# Patient Record
Sex: Female | Born: 1980 | Race: White | Hispanic: Yes | Marital: Single | Smoking: Never smoker
Health system: Southern US, Community
[De-identification: ages and names within clinical notes are randomized; demographics above are authoritative.]

## PROBLEM LIST (undated history)

## (undated) DIAGNOSIS — Z789 Other specified health status: Secondary | ICD-10-CM

## (undated) HISTORY — PX: APPENDECTOMY: SHX54

---

## 2019-10-09 ENCOUNTER — Inpatient Hospital Stay (HOSPITAL_COMMUNITY)
Admission: EM | Admit: 2019-10-09 | Discharge: 2019-10-10 | Disposition: A | Discharge status: Home / Self Care | Payer: Self-pay | Attending: Family Medicine | Admitting: Family Medicine

## 2019-10-09 ENCOUNTER — Emergency Department (HOSPITAL_COMMUNITY): Payer: Self-pay

## 2019-10-09 ENCOUNTER — Other Ambulatory Visit (HOSPITAL_COMMUNITY): Payer: Self-pay

## 2019-10-09 DIAGNOSIS — R519 Headache, unspecified: Secondary | ICD-10-CM | POA: Insufficient documentation

## 2019-10-09 DIAGNOSIS — R109 Unspecified abdominal pain: Secondary | ICD-10-CM | POA: Insufficient documentation

## 2019-10-09 DIAGNOSIS — Z3490 Encounter for supervision of normal pregnancy, unspecified, unspecified trimester: Secondary | ICD-10-CM

## 2019-10-09 DIAGNOSIS — Z9089 Acquired absence of other organs: Secondary | ICD-10-CM | POA: Insufficient documentation

## 2019-10-09 DIAGNOSIS — O26892 Other specified pregnancy related conditions, second trimester: Secondary | ICD-10-CM | POA: Insufficient documentation

## 2019-10-09 DIAGNOSIS — O3482 Maternal care for other abnormalities of pelvic organs, second trimester: Secondary | ICD-10-CM | POA: Insufficient documentation

## 2019-10-09 DIAGNOSIS — Z3A18 18 weeks gestation of pregnancy: Secondary | ICD-10-CM

## 2019-10-09 DIAGNOSIS — O09522 Supervision of elderly multigravida, second trimester: Secondary | ICD-10-CM | POA: Insufficient documentation

## 2019-10-09 DIAGNOSIS — N83201 Unspecified ovarian cyst, right side: Secondary | ICD-10-CM

## 2019-10-09 HISTORY — DX: Other specified health status: Z78.9

## 2019-10-09 LAB — URINALYSIS, ROUTINE W REFLEX MICROSCOPIC
Bilirubin Urine: NEGATIVE
Glucose, UA: NEGATIVE mg/dL
Ketones, ur: NEGATIVE mg/dL
Nitrite: NEGATIVE
Protein, ur: NEGATIVE mg/dL
Specific Gravity, Urine: 1.008 (ref 1.005–1.030)
pH: 6 (ref 5.0–8.0)

## 2019-10-09 LAB — I-STAT BETA HCG BLOOD, ED (MC, WL, AP ONLY): I-stat hCG, quantitative: >2000 m[IU]/mL — ABNORMAL HIGH (ref <5)

## 2019-10-09 LAB — COMPREHENSIVE METABOLIC PANEL
ALT: 18 U/L (ref 0–44)
AST: 20 U/L (ref 15–41)
Albumin: 3.2 g/dL — ABNORMAL LOW (ref 3.5–5.0)
Alkaline Phosphatase: 76 U/L (ref 38–126)
Anion gap: 9 (ref 5–15)
BUN: 8 mg/dL (ref 6–20)
CO2: 23 mmol/L (ref 22–32)
Calcium: 9.2 mg/dL (ref 8.9–10.3)
Chloride: 104 mmol/L (ref 98–111)
Creatinine, Ser: 0.58 mg/dL (ref 0.44–1.00)
GFR calc Af Amer: >60 mL/min (ref >60)
GFR calc non Af Amer: >60 mL/min (ref >60)
Glucose, Bld: 86 mg/dL (ref 70–99)
Potassium: 3.5 mmol/L (ref 3.5–5.1)
Sodium: 136 mmol/L (ref 135–145)
Total Bilirubin: 0.3 mg/dL (ref 0.3–1.2)
Total Protein: 7 g/dL (ref 6.5–8.1)

## 2019-10-09 LAB — CBC
HCT: 38 % (ref 36.0–46.0)
Hemoglobin: 12.4 g/dL (ref 12.0–15.0)
MCH: 26.2 pg (ref 26.0–34.0)
MCHC: 32.6 g/dL (ref 30.0–36.0)
MCV: 80.3 fL (ref 80.0–100.0)
Platelets: 325 10*3/uL (ref 150–400)
RBC: 4.73 MIL/uL (ref 3.87–5.11)
RDW: 13.2 % (ref 11.5–15.5)
WBC: 11.5 10*3/uL — ABNORMAL HIGH (ref 4.0–10.5)
nRBC: 0 % (ref 0.0–0.2)

## 2019-10-09 LAB — LIPASE, BLOOD: Lipase: 27 U/L (ref 11–51)

## 2019-10-09 NOTE — ED Triage Notes (Signed)
Pt here with reports of headaches, nausea, epigastric pain. Pt also states she is concerned she might be pregnant. States irregular periods and lmp may. Denies sick contacts.

## 2019-10-10 ENCOUNTER — Encounter (HOSPITAL_COMMUNITY): Payer: Self-pay | Admitting: Family Medicine

## 2019-10-10 ENCOUNTER — Other Ambulatory Visit: Payer: Self-pay

## 2019-10-10 DIAGNOSIS — N83201 Unspecified ovarian cyst, right side: Secondary | ICD-10-CM

## 2019-10-10 LAB — WET PREP, GENITAL
Clue Cells Wet Prep HPF POC: NONE SEEN
Sperm: NONE SEEN
Trich, Wet Prep: NONE SEEN

## 2019-10-10 LAB — CULTURE, OB URINE

## 2019-10-10 NOTE — MAU Note (Signed)
Pt sent over from ED tonight for rt side abd pain, pt is 18 weeks , preg confirmed tonight. Pt reports the pain has been going on x 1 week, some nausea and vomiting associated with pain and also headaches off/on.

## 2019-10-10 NOTE — MAU Provider Note (Signed)
History     CSN: [id number]  Arrival date and time: 10/09/19 1621   First Provider Initiated Contact with Patient 10/10/19 0330      Chief Complaint  Patient presents with  . Headache  . Abdominal Pain   Diana Knapp is a 39 y.o. G2P1 at Unknown who presents to MAU for abdominal pain on the right side and a headache. Patient originally presented to the ED and waited for 10 hours prior to being seen and was then transferred to MAU. Patient denies any symptoms, including abdominal pain or headache at this time. Patient reports her appendix was removed in the past. Patient reports even though her pain has resolved, she came to MAU because of the cyst they found on her right ovary on the US performed in the ED.  Pt denies VB, LOF, ctx, decreased FM, vaginal discharge/odor/itching. Pt denies N/V, abdominal pain, constipation, diarrhea, or urinary problems. Pt denies fever, chills, fatigue, sweating or changes in appetite. Pt denies SOB or chest pain. Pt denies dizziness, HA, light-headedness, weakness.  Spanish interpreter used for entire visit.   OB History    Gravida  2   Para  1   Term      Preterm      AB      Living  1     SAB      TAB      Ectopic      Multiple      Live Births              Past Medical History:  Diagnosis Date  . Medical history non-contributory     Past Surgical History:  Procedure Laterality Date  . APPENDECTOMY      No family history on file.  Social History   Tobacco Use  . Smoking status: Never Smoker  . Smokeless tobacco: Never Used  Substance Use Topics  . Alcohol use: Never  . Drug use: Never    Allergies: No Known Allergies  No medications prior to admission.    Review of Systems  Constitutional: Negative for chills, diaphoresis, fatigue and fever.  Eyes: Negative for visual disturbance.  Respiratory: Negative for shortness of breath.   Cardiovascular: Negative for chest pain.  Gastrointestinal:  Negative for abdominal pain, constipation, diarrhea, nausea and vomiting.  Genitourinary: Negative for dysuria, flank pain, frequency, pelvic pain, urgency, vaginal bleeding and vaginal discharge.  Neurological: Negative for dizziness, weakness, light-headedness and headaches.   Physical Exam   Blood pressure 131/73, pulse 87, temperature 98.4 F (36.9 C), temperature source Oral, resp. rate 17, height 5' 6.14" (1.68 m), weight 74.8 kg, SpO2 100 %.  Patient Vitals for the past 24 hrs:  BP Temp Temp src Pulse Resp SpO2 Height Weight  10/10/19 0254 131/73 98.4 F (36.9 C) Oral 87 17 100 % -- --  10/10/19 0143 106/61 98.2 F (36.8 C) Oral 76 18 100 % -- --  10/09/19 1629 133/80 -- -- 97 16 98 % 5' 6.14" (1.68 m) 74.8 kg   Physical Exam Vitals and nursing note reviewed. Exam conducted with a chaperone present.  Constitutional:      General: She is not in acute distress.    Appearance: Normal appearance. She is well-developed and normal weight. She is not ill-appearing, toxic-appearing or diaphoretic.  HENT:     Head: Normocephalic and atraumatic.  Pulmonary:     Effort: Pulmonary effort is normal.  Abdominal:     General: There is no distension.  Palpations: Abdomen is soft. There is no mass.     Tenderness: There is no abdominal tenderness. There is no guarding or rebound.  Genitourinary:    General: Normal vulva.     Labia:        Right: No rash, tenderness or lesion.        Left: No rash, tenderness or lesion.   Skin:    General: Skin is warm and dry.  Neurological:     Mental Status: She is alert and oriented to person, place, and time.  Psychiatric:        Behavior: Behavior normal.        Thought Content: Thought content normal.        Judgment: Judgment normal.    Results for orders placed or performed during the hospital encounter of 10/09/19 (from the past 24 hour(s))  Lipase, blood     Status: None   Collection Time: 10/09/19  5:35 PM  Result Value Ref Range    Lipase 27 11 - 51 U/L  Comprehensive metabolic panel     Status: Abnormal   Collection Time: 10/09/19  5:35 PM  Result Value Ref Range   Sodium 136 135 - 145 mmol/L   Potassium 3.5 3.5 - 5.1 mmol/L   Chloride 104 98 - 111 mmol/L   CO2 23 22 - 32 mmol/L   Glucose, Bld 86 70 - 99 mg/dL   BUN 8 6 - 20 mg/dL   Creatinine, Ser 0.980.58 0.44 - 1.00 mg/dL   Calcium 9.2 8.9 - 11.910.3 mg/dL   Total Protein 7.0 6.5 - 8.1 g/dL   Albumin 3.2 (L) 3.5 - 5.0 g/dL   AST 20 15 - 41 U/L   ALT 18 0 - 44 U/L   Alkaline Phosphatase 76 38 - 126 U/L   Total Bilirubin 0.3 0.3 - 1.2 mg/dL   GFR calc non Af Amer >60 >60 mL/min   GFR calc Af Amer >60 >60 mL/min   Anion gap 9 5 - 15  CBC     Status: Abnormal   Collection Time: 10/09/19  5:35 PM  Result Value Ref Range   WBC 11.5 (H) 4.0 - 10.5 K/uL   RBC 4.73 3.87 - 5.11 MIL/uL   Hemoglobin 12.4 12.0 - 15.0 g/dL   HCT 14.738.0 36 - 46 %   MCV 80.3 80.0 - 100.0 fL   MCH 26.2 26.0 - 34.0 pg   MCHC 32.6 30.0 - 36.0 g/dL   RDW 82.913.2 56.211.5 - 13.015.5 %   Platelets 325 150 - 400 K/uL   nRBC 0.0 0.0 - 0.2 %  I-Stat beta hCG blood, ED     Status: Abnormal   Collection Time: 10/09/19  5:47 PM  Result Value Ref Range   I-stat hCG, quantitative >2,000.0 (H) <5 mIU/mL   Comment 3          Urinalysis, Routine w reflex microscopic Urine, Clean Catch     Status: Abnormal   Collection Time: 10/09/19  8:10 PM  Result Value Ref Range   Color, Urine YELLOW YELLOW   APPearance HAZY (A) CLEAR   Specific Gravity, Urine 1.008 1.005 - 1.030   pH 6.0 5.0 - 8.0   Glucose, UA NEGATIVE NEGATIVE mg/dL   Hgb urine dipstick SMALL (A) NEGATIVE   Bilirubin Urine NEGATIVE NEGATIVE   Ketones, ur NEGATIVE NEGATIVE mg/dL   Protein, ur NEGATIVE NEGATIVE mg/dL   Nitrite NEGATIVE NEGATIVE   Leukocytes,Ua LARGE (A) NEGATIVE   RBC /  HPF 0-5 0 - 5 RBC/hpf   WBC, UA 21-50 0 - 5 WBC/hpf   Bacteria, UA MANY (A) NONE SEEN   Squamous Epithelial / LPF 11-20 0 - 5   Mucus PRESENT    US OB  Limited  Result Date: 10/09/2019 CLINICAL DATA:  39 year old female is pregnant with unknown dates, abdominal pain. EXAM: LIMITED OBSTETRIC ULTRASOUND FINDINGS: Number of Fetuses: 1 Heart Rate:  150 bpm Movement: Present Presentation: Cephalic Placental Location: Posterior on the right Previa: Negative Amniotic Fluid (Subjective):  Within normal limits. Femur length: 2.6 cm 18 w  0 d MATERNAL FINDINGS: Cervix:  Appears closed. Uterus/Adnexae: Right ovary roughly 5.5 cm anechoic cyst with no vascular elements (image 42). The left ovary appears normal with a small 10 mm cyst or follicle (image 55). No pelvic free fluid. IMPRESSION: 1. Viable Singleton fetus with estimated gestational age of [redacted] weeks and 0 days by femur length. 2. Simple appearing right ovarian cyst, 5.5 cm. Recommend a postpartum follow-up ultrasound. This exam is performed on an emergent basis and does not comprehensively evaluate fetal size, dating, or anatomy; follow-up complete OB US should be considered if further fetal assessment is warranted. Electronically Signed   By: Odessa Fleming M.D.   On: 10/09/2019 23:12    MAU Course  Procedures  MDM -pt with right-sided abdominal pain and HA that resolved while waiting in ED without treatment -hx of appendectomy -normal abdominal exam without tenderness -cervix long/closed -no HA at this time -labs from ED: lipase, CMP, CBC - WNL -UA: hazy/sm hgb/lg leuks/many bacteria, urine sent for culture -[place] ordered from ED: single IUP, [redacted]w[redacted]d, FHR 150, vtx, posterior placenta, subjectively normal fluid, cervix closed, 5.5cm cyst on right ovary requiring ppartum f/u, no free fluid -WetPrep/GC/CT collected in MAU -pt discharged to home in stable condition  Orders Placed This Encounter  Procedures  . Culture, OB Urine    Standing Status:   Standing    Number of Occurrences:   1  . Wet prep, genital    Standing Status:   Standing    Number of Occurrences:   1  . US OB Limited    Standing Status:    Standing    Number of Occurrences:   1    Order Specific Question:   Symptom/Reason for Exam    Answer:   Pregnancy [170017]  . Lipase, blood    Standing Status:   Standing    Number of Occurrences:   1  . Comprehensive metabolic panel    Standing Status:   Standing    Number of Occurrences:   1  . CBC    Standing Status:   Standing    Number of Occurrences:   1  . Urinalysis, Routine w reflex microscopic    Standing Status:   Standing    Number of Occurrences:   1  . Diet NPO time specified    Standing Status:   Standing    Number of Occurrences:   1  . I-Stat beta hCG blood, ED    Standing Status:   Standing    Number of Occurrences:   1  . Discharge patient    Order Specific Question:   Discharge disposition    Answer:   01-Home or Self Care [1]    Order Specific Question:   Discharge patient date    Answer:   10/10/2019   No orders of the defined types were placed in this encounter.   Assessment and Plan  1. Right ovarian cyst   2. Pregnancy   3. Intrauterine pregnancy   4. [redacted] weeks gestation of pregnancy     Allergies as of 10/10/2019   No Known Allergies     Medication List    You have not been prescribed any medications.     -will call with culture results, if positive -start [facility] -safe meds in pregnancy list given -OB providers list given -return MAU precautions given -pt discharged to home in stable condition  Joni Reining E Salle Brandle 10/10/2019, 3:54 AM

## 2019-10-10 NOTE — ED Provider Notes (Signed)
Patient to ED for evaluation of lower abdominal pain and cramping, points to RLQ/right pelvis. No vaginal bleeding or discharge. No fever. She has had nausea. She was suspicious she was pregnant but not certain. LMP May 2021 - normally regular periods.   Pregnancy confirmed after arrival to the ED.   Patient with low right abdominal pain in early pregnancy. No bleeding. Mild hypotension, asymptomatic.   Discussed with Dr. Adrian Blackwater who will see her in the MAU.   Elpidio Anis, PA-C 10/11/19 0351    Shon Baton, MD 10/11/19 [phone]

## 2019-10-10 NOTE — Discharge Instructions (Signed)
Las medicinas seguras para tomar Academic librarian  Safe Medications in Pregnancy  Acn:  Benzoyl Peroxide (Perxido de benzolo)  Salicylic Acid (cido saliclico)  Dolor de espalda/Dolor de cabeza:  Tylenol: 2 pastillas de concentracin regular cada 4 horas O 2 pastillas de concentracin fuerte cada 6 horas  Resfriados/Tos/Alergias:  Benadryl (sin alcohol) 25 mg cada 6 horas segn lo necesite Breath Right strips (Tiras para respirar correctamente)  Claritin  Cepacol (pastillas de chupar para la garganta)  Chloraseptic (aerosol para la garganta)  Cold-Eeze- hasta tres veces por da  Cough drops (pastillas de chupar para la tos, sin alcohol)  Flonase (con receta mdica solamente)  Guaifenesin  Mucinex  Robitussin DM (simple solamente, sin alcohol)  Saline nasal spray/drops (Aerosol nasal salino/gotas) Sudafed (pseudoephedrine) y  Actifed * utilizar slo despus de 12 semanas de gestacin y si no tiene la presin arterial alta.  Tylenol Vicks  VapoRub  Zinc lozenges (pastillas para la garganta)  Zyrtec  Estreimiento:  Colace  Ducolax (supositorios)  Fleet enema (lavado intestinal rectal)  Glycerin (supositorios)  Metamucil  Milk of magnesia (leche de magnesia)  Miralax  Senokot  Smooth Move (t)  Diarrea:  Kaopectate Imodium A-D  *NO tome Pepto-Bismol  Hemorroides:  Anusol  Anusol HC  Preparation H  Tucks  Indigestin:  Tums  Maalox  Mylanta  Zantac  Pepcid  Insomnia:  Benadryl (sin alcohol) 25mg  cada 6 horas segn lo necesite  Tylenol PM  Unisom, no Gelcaps  Calambres en las piernas:  Tums  MagGel Nuseas/Vmitos:  Bonine  Dramamine  Emetrol  Ginger (extracto)  Sea-Bands  Meclizine  Medicina para las nuseas que puede tomar durante el embarazo: Unisom (doxylamine succinate, pastillas de 25 mg) Tome una pastilla al da al [place]. Si los sntomas no estn adecuadamente controlados, la dosis puede aumentarse hasta una dosis mxima recomendada de [employer] al da (1/2 pastilla por la [place], 1/2 pastilla a media tarde y Neomia Dear pastilla al [place]). Pastillas de Vitamina B6 de 100mg . Tome ConAgra Foods veces al da (hasta 200 mg por da).  Erupciones en la piel:  Productos de Aveeno  Benadryl cream (crema o una dosis de 25mg  cada 6 horas segn lo necesite)  Calamine Lotion (locin)  1% cortisone cream (crema de cortisona de 1%)  nfeccin vaginal por hongos (candidiasis):  Gyne-lotrimin 7  Monistat 7   **Si est tomando varias medicinas, por favor revise las etiquetas para Art gallery manager los mismos ingredientes [place]. **Tome la medicina segn lo indicado en la etiqueta. **No tome ms de 400 mg de Tylenol en 24 horas. **No tome medicinas que contengan aspirina o ibuprofeno.          Cuidados prenatales Prenatal Care El cuidado prenatal es la atencin de la salud durante el [place]. Ayuda a que usted y su beb en gestacin (feto) se mantengan tan saludables como sea posible. El cuidado prenatal puede brindarlo Agustina Caroli, un mdico de atencin primaria o un especialista en parto y Psychiatrist ([place]). Jill Alexanders modo me afecta? Durante el embarazo, la controlarn minuciosamente para Engineer, manufacturing cualquier afeccin que Agricultural consultant. Para disminuir el riesgo de sufrir Clinical cytogeneticist, usted y el mdico hablarn acerca de las afecciones subyacentes que tenga. Cmo afecta esto al beb? El cuidado prenatal recibido desde un principio y de forma peridica aumenta la probabilidad de que su beb permanezca sano durante el embarazo. El cuidado prenatal disminuye el riesgo de que el beb:  Nazca de forma temprana (prematuramente).  Sea  ms pequeo de lo esperado al nacer (pequeo para la edad gestacional). Qu puedo esperar en la primera visita de cuidado prenatal? Su primera visita de cuidado prenatal probablemente ser la ms larga. Programe la primera visita de cuidado prenatal tan pronto como advierta que est  embarazada. Su primera visita es un buen momento para hacer preguntas o hablar sobre las inquietudes que tenga acerca del [place]. En la visita, usted y el mdico hablarn acerca de:  Los antecedentes mdicos, incluidos los siguientes: ? Printmaker anterior. ? Sus antecedentes mdicos familiares. ? Los antecedentes mdicos del padre del beb. ? Cualquier afeccin de salud a largo plazo (crnica) que tenga y cmo controlarla. ? Cirugas o procedimientos a los que se someti. ? Consumo actual de medicamentos recetados o de venta libre, hierbas o suplementos.  Otros factores que podran representar un riesgo para el beb, incluidos los siguientes:  Su entorno Facilities manager y sus niveles de estrs, por ejemplo: ? Exposicin al abuso o la violencia. ? Problemas econmicos en casa. ? Afecciones de salud mental que tenga.  Sus hbitos de salud diarios, incluida la dieta y la actividad fsica. Su mdico tambin:  Medir su peso, altura y presin arterial.  Le realizar un examen fsico, incluido un examen plvico y [place].  Le realizar anlisis de [place] y [place] para detectar: ? Infeccin de las vas urinarias. ? Enfermedades de transmisin sexual (ETS). ? Niveles bajos de hierro en la sangre (anemia). ? Grupo sanguneo y ciertas protenas en los glbulos rojos (anticuerpos Rh). ? Infecciones e inmunidad a los virus, como el de la hepatitis B y Social research officer, government. ? VIH (virus de inmunodeficiencia humana).  Le realizar una ecografa para confirmar el crecimiento y desarrollo del beb, y para ayudar a predecir la fecha estimada (estimated due date, EDD) para el nacimiento. Esta ecografa se realiza con una sonda que se introduce en la vagina (ecografa transvaginal).  Analizar sus opciones de estudios de deteccin genticos.  Le brindar informacin acerca de cmo mantenerse sana y Pharmacologist sano a su beb, por ejemplo: ? Nutricin y vitaminas. ? Actividad fsica. ? Cmo controlar los  sntomas del [place], como nuseas y vmitos (nuseas matutinas). ? Infecciones y sustancias que pueden ser perjudiciales para el beb, y cmo evitarlas. ? [employer]. ? Cuidado dental. ? Trabajo. ? Viajes. ? Signos de advertencia a los que debe estar atenta y cundo llamar al mdico. Con qu frecuencia tendr que asistir a visitas de cuidado prenatal? Despus de la primera visita de cuidado prenatal, tendr que asistir a visitas en forma regular durante el embarazo. El cronograma de visitas a menudo es el siguiente:  Hasta la semana 28 de embarazo: una vez cada 4 semanas.  Desde la semana 28 a la 36: una vez cada 2 semanas.  Despus de la semana 36: todas las semanas [employer] . Es posible que algunas mujeres deban asistir a visitas con mayor o Adult nurse frecuencia segn las afecciones subyacentes de salud que tengan y la salud del beb. Concurra a todas las visitas de cuidado prenatal y de seguimiento como se lo haya indicado el mdico. Esto es importante. Qu sucede durante las visitas rutinarias de cuidado prenatal? Su mdico:  Medir su peso y presin arterial.  Verificar la presencia de sonidos cardacos fetales.  Medir la altura de su tero y abdomen (altura uterina). Esta puede medirse aproximadamente a partir de la semana 20 del [place].  Controlar la posicin del beb dentro del tero.  Conley Rolls  har preguntas acerca de su dieta, patrones de sueo y si puede sentir los movimientos del beb.  Revisar los signos de advertencia a los que debe estar atenta y los signos del trabajo de [place].  Le preguntar acerca de los sntomas relacionados con el embarazo que tenga y cmo est lidiando con ellos. Entre los sntomas se pueden incluir los siguientes: ? Dolores de [place]. ? Nuseas y vmitos. ? Secrecin vaginal. ? Hinchazn. ? Fatiga. ? Estreimiento. ? Cualquier molestia, incluido el dolor plvico o de espalda. Haga una lista de las preguntas que tenga  para hacerle al mdico en las visitas de [place]. Qu estudios me podran Scientist, product/process development las visitas de cuidado prenatal? Es posible que le realicen anlisis de [place], [place] y estudios de diagnstico por imgenes durante todo el [place], como los siguientes:  Este anlisis examina la presencia de glucosa, protenas o signos de infeccin en la orina.  Pruebas de glucosa para detectar una forma de diabetes que pueda desarrollarse durante el embarazo (diabetes mellitus gestacional). Generalmente, esto se realiza alrededor de lasemana 24 de embarazo.  Sherlyn Lees para verificar el crecimiento y desarrollo del beb y Engineer, manufacturing defectos congnitos. Generalmente, esto se realiza alrededor de lasemana 20 de embarazo.  Un estudio para Arboriculturist infeccin por estreptococos del grupo B (EGB). Generalmente, esto se realiza alrededor de lasemana 36 de embarazo.  Pruebas genticas. Estos pueden incluir anlisis de sangre o estudios de diagnstico por imgenes, como una ecografa. Algunos estudios genticos se Web designer trimestre de Psychiatrist y otros durante el segundo trimestre. Qu otras cosas puedo esperar durante las visitas de cuidado prenatal? El mdico puede recomendarle que se aplique algunas vacunas durante el [place]. Estas pueden incluir las siguientes:  Una vacuna contra la gripe anual. Esto es especialmente importante si estar embarazada durante la temporada de gripe.  La vacuna Tdap (ttanos, difteria y [place]). Recibir esta vacuna durante el embarazo puede proteger al beb de la tos convulsa (tos ferina) despus del nacimiento. Esta vacuna puede recomendarse [employer] 27 y 36 de [place]. Ms adelante en su Vanetta Mulders, su mdico puede proporcionarle informacin acerca de:  Clases para prepararse para el parto y Patent examiner.  Cmo elegir un mdico para el beb.  Bancos de cordn umbilical.  Lactancia materna.  Utilizacin de mtodos anticonceptivos  despus del nacimiento del beb.  La unidad de parto y [place] de parto del hospital, y cmo programar una visita.  Cmo registrarse en el hospital antes de comenzar el [place] de [place]. Dnde buscar ms informacin  Oficina para la Salud de Architectural technologist (Office on [employer] Health): [email or web address]  Asociacin [place]wyn[place]eth Aliment del [place] (American Pregnancy Association): [email or web address].  March of Dimes: marchofdimes.org Resumen  El cuidado prenatal [place] a que usted y su beb se mantengan tan saludables como sea posible durante el [place].  Su primera visita de cuidado prenatal probablemente ser la ms larga.  Tendr que asistir a visitas y International aid/development worker estudios durante todo el embarazo para Chief Operating Officer su salud y la salud del beb.  Lleve una lista de preguntas para hacerle al mdico durante las visitas.  Asegrese de concurrir a todas las visitas de cuidado prenatal y de seguimiento con su mdico. Esta informacin no tiene Theme park manager el consejo del mdico. Asegrese de hacerle al mdico cualquier pregunta que tenga. Document Revised: 04/07/2017 Document Reviewed: 04/07/2017 Elsevier Patient Education  2020 Elsevier Inc.        Dolor abdominal durante el embarazo Abdominal Pain During Pregnancy  El dolor abdominal es comn durante el [place] y tiene muchas causas posibles. Algunas causas son ms graves que otras, y a Advertising account executive causa se desconoce. El dolor abdominal puede ser un indicio de que est comenzando el [place]. Tambin puede ser ocasionado por el crecimiento y estiramiento de los msculos y ligamentos durante el Psychiatrist. Siempre informe a su mdico si siente dolor abdominal. Siga estas indicaciones en su casa:  No tenga relaciones sexuales ni se coloque nada dentro de la vagina hasta que el dolor haya desaparecido completamente.  Descanse todo lo que pueda [employer] dolor se le haya calmado.  Beba suficiente lquido para Photographer orina de color amarillo  plido.  Tome los medicamentos de venta libre y los recetados solamente como se lo haya indicado el mdico.  Oceanographer a todas las visitas de control como se lo haya indicado el mdico. Esto es importante. Comunquese con un mdico si:  El dolor contina o empeora despus de Lawyer.  Siente dolor en la parte inferior del abdomen que: ? Va y viene en intervalos regulares. ? Se extiende a la espalda. ? Es parecido a los Tree surgeon.  Siente dolor o ardor al Geographical information systems officer. Solicite ayuda de inmediato si:  Tiene fiebre o siente escalofros.  Tiene una hemorragia vaginal abundante.  Tiene una prdida de lquido por la vagina.  Elimina tejidos por la vagina.  Vomita o tiene diarrea durante ms de 24horas.  El beb se mueve menos de lo habitual.  Se siente dbil o se desmaya.  Le falta el aire.  Siente dolor intenso en la parte superior del abdomen. Resumen  El dolor abdominal es comn durante el [place] y tiene muchas causas posibles.  Si siente dolor abdominal durante el embarazo, informe al mdico de inmediato.  Siga las indicaciones del mdico para el cuidado en el hogar y concurra a todas las visitas de control como se lo hayan indicado. Esta informacin no tiene Theme park manager el consejo del mdico. Asegrese de hacerle al mdico cualquier pregunta que tenga. Document Revised: 08/10/2016 Document Reviewed: 08/10/2016 Elsevier Patient Education  2020 [employer].       Prenatal Care Providers           [facility] @ [facility] - accepts patients without insurance  Phone: [phone]  [facility] [employer] @ [facility]   Phone: [phone]  [facility]       Phone: [phone]            [facility] @ [place]     Phone: [phone]          Center for [id number] @ [employer]   Phone: [phone]  [facility] @ [facility] - accepts patients  without insurance  Phone: [phone]  [facility] for [facility] @ [facility] Phone: [phone]     [facility] Department - accepts patients without insurance Phone: [phone]  [facility]  Phone: [phone]  [facility] Phone: [phone]  [facility] Phone: [phone]  [facility] Phone: [phone]  [facility] Phone: [phone]  [facility]  Phone: [phone]         [phone] trimestre de embarazo Second Trimester of Pregnancy El segundo trimestre va desde la semana14 hasta la 27, desde el cuarto hasta el sexto mes, y suele ser el momento en el que mejor se siente. Su organismo se ha adaptado a Gwynneth Aliment,  y comienza a sentirse fsicamente mejor. En general, las nuseas matutinas han disminuido o han desaparecido completamente, puede tener ms energa y un aumento de apetito. El segundo trimestre es tambin la poca en la que el feto se desarrolla rpidamente. Hacia el final del sexto mes, el feto mide aproximadamente 9pulgadas (23cm) y pesa alrededor de 1 libras (700g). Es probable que sienta que el beb se Teacher, English as a foreign language (da pataditas) entre las 16 y 20semanas del Psychiatrist. Cambios en el cuerpo durante el segundo trimestre Su cuerpo continua experimentando numerosos cambios durante su segundo trimestre. Estos cambios varan de [place] a [place].  Seguir American Standard Companies. Notar que la parte baja del abdomen sobresale.  Podrn aparecer las primeras Albertson's caderas, el abdomen y las [place].  Es posible que tenga dolores de cabeza que pueden aliviarse con ciertos medicamentos. Los medicamentos que tome deben estar aprobados por el mdico.  Tal vez tenga necesidad de orinar con ms frecuencia porque el feto est ejerciendo presin sobre la vejiga.  Debido al Vanetta Mulders podr sentir Anthoney Harada estomacal con frecuencia.  Puede estar estreida, ya que ciertas  hormonas enlentecen los movimientos de los msculos que [employer] Life Insurance desechos a travs de los intestinos.  Pueden aparecer hemorroides o abultarse e hincharse las venas (venas varicosas).  Puede sentir dolor en la espalda. Esto se debe a: ? Aumento de peso. ? Las hormonas del Management consultant las articulaciones en la pelvis. ? Un cambio en el peso y los msculos que ayudan a Pharmacologist su equilibrio.  Sus pechos seguirn creciendo y se pondrn cada vez ms sensibles.  Las Veterinary surgeon y estar sensibles al cepillado y al hilo dental.  Pueden aparecer zonas oscuras o manchas (cloasma, mscara del [place]) en el rostro. Esto probablemente se atenuar despus del nacimiento del beb.  Es posible que se forme una lnea oscura desde el ombligo hasta la zona del pubis (linea nigra). Esto probablemente se atenuar despus del nacimiento del beb.  Tal vez haya cambios en el cabello. Esto cambios pueden incluir su engrosamiento, crecimiento rpido y [employer] Waste Industries textura. Adems, a algunas mujeres se les cae el cabello durante o despus del embarazo, o tienen el cabello seco o fino. Lo ms probable es que el cabello se le normalice despus del nacimiento del beb. Qu debe esperar en las visitas prenatales Durante una visita prenatal de rutina:  La pesarn para asegurarse de que usted y el feto estn creciendo normalmente.  Le tomarn la presin arterial.  Le medirn el abdomen para controlar el desarrollo del beb.  Se escucharn los latidos cardacos fetales.  Se evaluarn los resultados de los estudios solicitados en visitas anteriores. El mdico puede preguntarle lo siguiente:  Cmo se siente.  Si siente los movimientos del beb.  Si ha tenido sntomas anormales, como prdida de lquido, [place], dolores de cabeza intensos o clicos abdominales.  Si est consumiendo algn producto que contenga tabaco, como cigarrillos, tabaco de Theatre manager y Administrator, Civil Service.  Si tiene  [employer]gate[employer]. Otros estudios que podrn realizarse durante el segundo trimestre incluyen lo siguiente:  Anlisis de sangre para detectar lo siguiente: ? Concentraciones de hierro bajas (anemia). ? Nivel alto de azcar en la sangre que afecta a las mujeres embarazadas (diabetes gestacional) entre las semanas 24 y 60. ? Anticuerpos Rh. Esto es para detectar una protena en los glbulos rojos (factor Rh).  Anlisis de orina para detectar infecciones, diabetes o protenas en la orina.  Una ecografa para confirmar que el beb  crece y se desarrolla correctamente.  Una amniocentesis para diagnosticar posibles problemas genticos.  Estudios del feto para descartar espina bfida y sndrome de [place].  Prueba del VIH (virus de inmunodeficiencia humana). Los exmenes prenatales de rutina incluyen la prueba de deteccin del VIH, a menos que decida no Futures trader. Siga estas indicaciones en su casa: Medicamentos  Siga las indicaciones del mdico en relacin con el uso de medicamentos. Durante el embarazo, hay medicamentos que pueden tomarse y otros que no.  Tome vitaminas prenatales que contengan por lo menos (?g) de cido flico.  Si est estreida, tome un laxante suave, si el mdico lo autoriza. Qu debe comer y beber   Meriel Flavors una dieta equilibrada que incluya gran cantidad de frutas y verduras frescas, cereales integrales, buenas fuentes de protenas como carnes [place], huevos o tofu, y lcteos descremados. El mdico la ayudar a Production assistant, radio cantidad de peso que puede [place].  No coma carne cruda ni quesos sin cocinar. Estos elementos contienen grmenes que pueden causar defectos congnitos en el beb.  Si no consume muchos alimentos con calcio, hable con su mdico sobre si debera tomar un suplemento diario de calcio.  Limite el consumo de alimentos con alto contenido de grasas y azcares procesados, como alimentos fritos o dulces.  Para evitar el  estreimiento: ? Bebe suficiente lquido para mantener la orina clara o de color amarillo plido. ? Consuma alimentos ricos en fibra, como frutas y verduras frescas, cereales integrales y frijoles. Actividad  Haga ejercicio solamente como se lo haya indicado el mdico. La mayora de las mujeres pueden continuar su rutina de ejercicios durante el [place]. Intente realizar como mnimo de actividad fsica por lo menos 5das a la semana. Deje de hacer ejercicio si experimenta contracciones uterinas.  No levante objetos pesados, use zapatos de tacones bajos y [address].  Puede seguir Calpine Corporation, a menos que el mdico le indique lo contrario. Alivio del dolor y del Dentist  Use un sostn que le brinde buen soporte para prevenir las molestias causadas por la sensibilidad en los pechos.  Dese baos de asiento con agua tibia para Engineer, materials o las molestias causadas por las hemorroides. Use una crema para las hemorroides si el mdico la autoriza.  Descanse con las piernas elevadas si tiene calambres o dolor de cintura.  Si tiene venas varicosas, use medias de descanso. Eleve los pies durante , 3 o 4veces por da. Limite el consumo de sal en su dieta. Cuidados prenatales  Escriba sus preguntas. Llvelas cuando concurra a las visitas prenatales.  Concurra a todas las visitas prenatales tal como se lo haya indicado el mdico. Esto es importante. Seguridad  Use el cinturn de seguridad en todo momento mientras conduce.  Haga una lista de los nmeros de telfono de Associate Professor, que [employer] nmeros de telfono de familiares, [place], el hospital y los departamentos de polica y bomberos. Instrucciones generales  Pdale al mdico que la derive a clases de educacin prenatal en su localidad. Debe comenzar a tomar las clases antes de que empiece el mes6 de [place].  Pida ayuda si tiene necesidades nutricionales o de asesoramiento  Academic librarian. El mdico puede aconsejarla o derivarla a especialistas para que la ayuden con diferentes necesidades.  No se d baos de inmersin en agua caliente, baos turcos ni saunas.  No se haga duchas vaginales ni use tampones o toallas higinicas perfumadas.  No mantenga las piernas cruzadas durante [place].  Evite el  contacto con las bandejas sanitarias de los gatos y la tierra que estos animales usan. Estos elementos contienen bacterias que pueden causar defectos congnitos al beb y la posible prdida del feto debido a un aborto espontneo o muerte fetal.  Evite fumar, consumir hierbas, beber alcohol y tomar frmacos que no le hayan recetado. Las sustancias qumicas que estos productos contienen pueden afectar la formacin y el desarrollo del beb.  No consuma ningn producto que contenga nicotina o tabaco, como cigarrillos y Administrator, Civil Servicecigarrillos electrnicos. Si necesita ayuda para dejar de fumar, consulte al American Expressmdico.  Visite a su dentista si an no lo ha Occupational hygienisthecho durante el embarazo. Use un cepillo de dientes blando para higienizarse los dientes y psese el hilo dental con suavidad. Comunquese con un mdico si:  Tiene mareos.  Siente clicos leves, presin en la pelvis o dolor persistente en el abdomen.  Tiene nuseas, vmitos o diarrea persistentes.  Brett Fairybserva una secrecin vaginal con mal olor.  Siente dolor al ConocoPhillipsorinar. Solicite ayuda de inmediato si:  Tiene fiebre.  Tiene una prdida de lquido por la vagina.  Tiene sangrado o pequeas prdidas vaginales.  Siente dolor intenso o clicos en el abdomen.  Sube de peso o baja de peso rpidamente.  Tiene dificultad para respirar y siente dolor de pecho.  Sbitamente se le hinchan mucho el rostro, las Costill[place]os, los tobillos, los pies o las piernas.  No ha sentido los movimientos del beb durante Georgianne Fickuna hora.  Siente un dolor de cabeza intenso que no se alivia al tomar United Parcelmedicamentos.  Nota cambios en la  visin. Resumen  El segundo trimestre va desde la semana14 hasta la 27, desde el cuarto hasta el sexto mes. Es tambin una poca en la que el feto se desarrolla rpidamente.  Su organismo atraviesa por muchos cambios durante el [place]embarazo. Estos cambios varan de [place]una mujer a [place]otra.  Evite fumar, consumir hierbas, beber alcohol y tomar frmacos que no le hayan recetado. Estas sustancias qumicas afectan la formacin y el desarrollo de su beb.  No consuma ningn producto que contenga tabaco, lo que incluye cigarrillos, tabaco de Theatre managermascar y Administrator, Civil Servicecigarrillos electrnicos. Si necesita ayuda para dejar de fumar, consulte al mdico.  Comunquese con su mdico si tiene preguntas sobre esto. Concurra a todas las visitas prenatales tal como se lo haya indicado el mdico. Esto es importante. Esta informacin no tiene Theme park managercomo fin reemplazar el consejo del mdico. Asegrese de hacerle al mdico cualquier pregunta que tenga. Document Revised: 06/28/2016 Document Reviewed: 06/28/2016 Elsevier Patient Education  2020 [employer]        Dolor del ligamento redondo Round Ligament Pain  El ligamento redondo es un cordn de msculo y tejido que sirve de sostn para Careers information officerel tero. Puede volverse una fuente de dolor durante el embarazo si se distiende o se torsiona a medida que el beb crece. Generalmente, el dolor Cendant Corporationempieza en el segundo trimestre (semanas 13 a 28) de Yeguadaembarazo, y Software engineerpuede aparecer y Landscape architectdesaparecer hasta el momento del [place]. No se trata de un problema grave y no es perjudicial para el beb. El dolor del ligamento redondo suele ser agudo y punzante, y durar poco tiempo, pero tambin puede ser sordo, persistente y continuo. Se lo percibe en la regin inferior del abdomen o en la ingle. A menudo comienza en la zona ms profunda de la ingle y se extiende hacia regin externa de la cadera. El dolor puede producirse cuando usted:  Cambia sbitamente de posicin, como pasar rpidamente de estar sentada a ponerse de  pie.  Se da vuelta en la cama.  Tose o estornuda.  Hace actividad fsica. Siga estas indicaciones en su casa:   Controle su afeccin para detectar cualquier cambio.  Cuando el dolor comience, reljese. Luego pruebe cualquiera de estos mtodos para aliviar el dolor: ? Psychologist, counselling. ? Flexionar las rodillas hacia el abdomen. ? Acostarse de costado con una almohada debajo del abdomen y [employer] las piernas. ? Sentarse en una baera con agua tibia durante 15 a o hasta que el dolor desaparezca.  Tome los medicamentos de venta libre y los recetados solamente como se lo haya indicado el mdico.  Muvase lentamente cuando se siente o se ponga de pie.  No haga caminatas largas si le generan dolor.  Suspenda o reduzca las actividades fsicas si Public relations account executive.  Concurra a todas las visitas de control como se lo haya indicado el mdico. Esto es importante. Comunquese con un mdico si:  El dolor no desaparece con Scientist, research (medical).  Tiene un dolor en la espalda que no tena antes.  El medicamento no resulta eficaz. Solicite ayuda inmediatamente si:  Tiene fiebre o escalofros.  Tiene contracciones uterinas.  Tiene una hemorragia vaginal abundante.  Tiene nuseas o vmitos.  Tiene diarrea.  Siente dolor al [employer]. Resumen  El dolor del ligamento redondo se siente en la parte inferior del abdomen o la ingle. Generalmente es un dolor agudo y punzante, y dura poco tiempo. Tambin puede ser un dolor sordo, persistente y continuo.  Este dolor por lo general empieza en el segundo trimestre (semanas 13 a 28). Se produce porque el tero se estira a medida que el beb crece, y no es perjudicial para el beb.  Usted puede notar el dolor cuando cambia sbitamente de posicin, cuando toce o estornuda, o durante la actividad fsica.  Relajarse, flexionar las rodillas hacia el abdomen, acostarse sobre un lado o tomar un bao de agua tibia pueden ayudar a Building control surveyor.  Solicite ayuda a su mdico si el dolor no desaparece o si tiene hemorragia vaginal, nuseas, vmitos, diarrea o dolor al [employer]. Esta informacin no tiene Theme park manager el consejo del mdico. Asegrese de hacerle al mdico cualquier pregunta que tenga. Document Revised: 09/29/2017 Document Reviewed: 09/29/2017 Elsevier Patient Education  2020 [employer].

## 2019-10-11 LAB — GC/CHLAMYDIA PROBE AMP (~~LOC~~) NOT AT ARMC
Chlamydia: NEGATIVE
Comment: NEGATIVE
Comment: NORMAL
Neisseria Gonorrhea: NEGATIVE

## 2021-11-27 IMAGING — US US OB LIMITED
1 series · 14 of 28 positions shown · non-contrast
Comparison: none

CLINICAL DATA: 39-year-old female is pregnant with unknown dates,
abdominal pain.

EXAM:
LIMITED OBSTETRIC ULTRASOUND

[Series 1: us ob comp less 14 wks · 59 acquisitions, 14 frames shown]
[im 3/59]
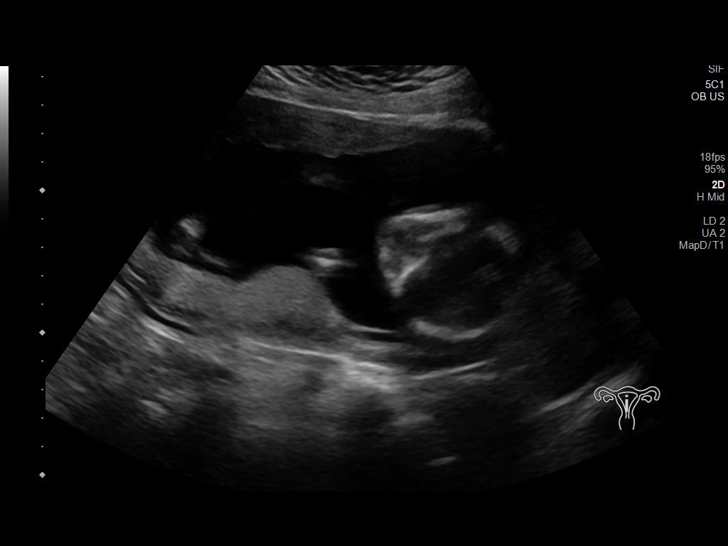
[im 7/59]
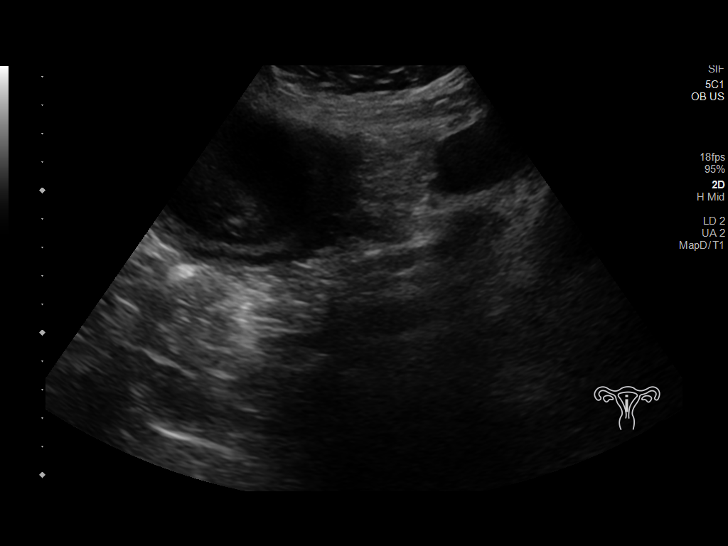
[im 11/59]
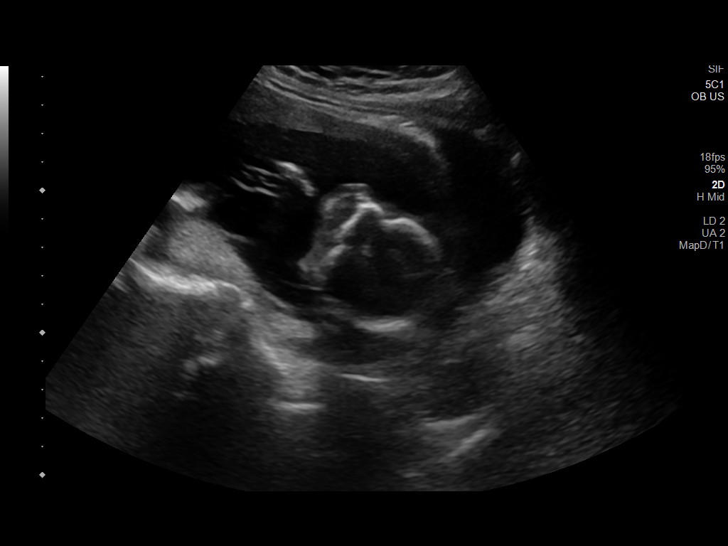
[im 16/59]
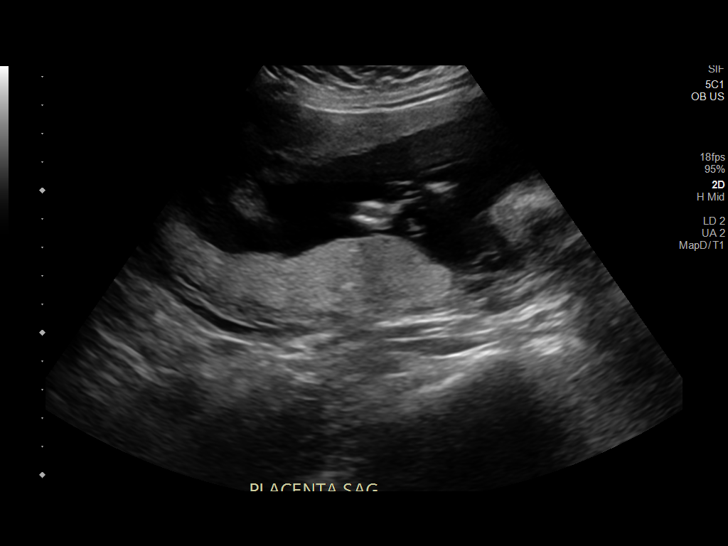
[im 20/59]
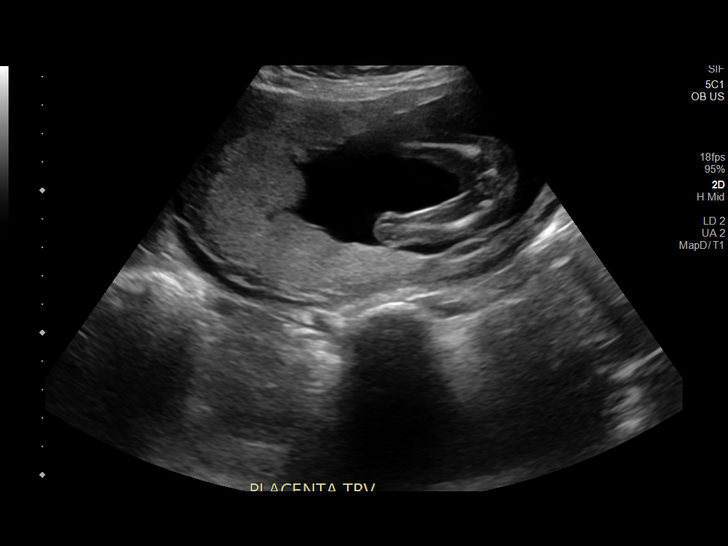
[im 24/59]
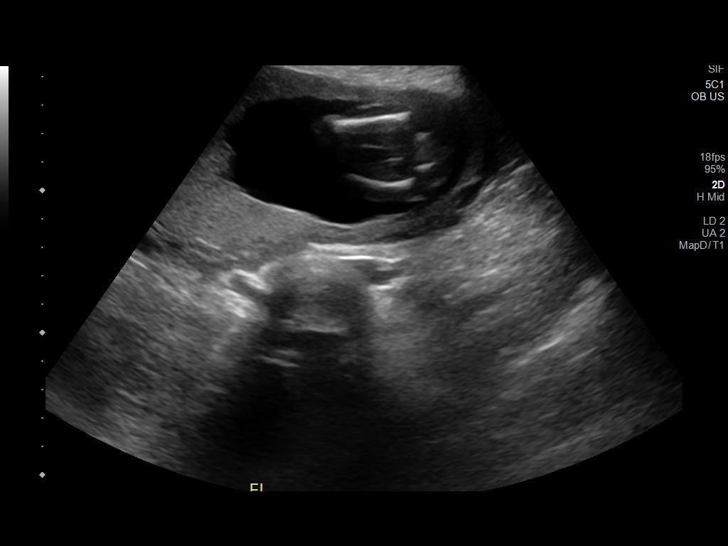
[im 28/59]
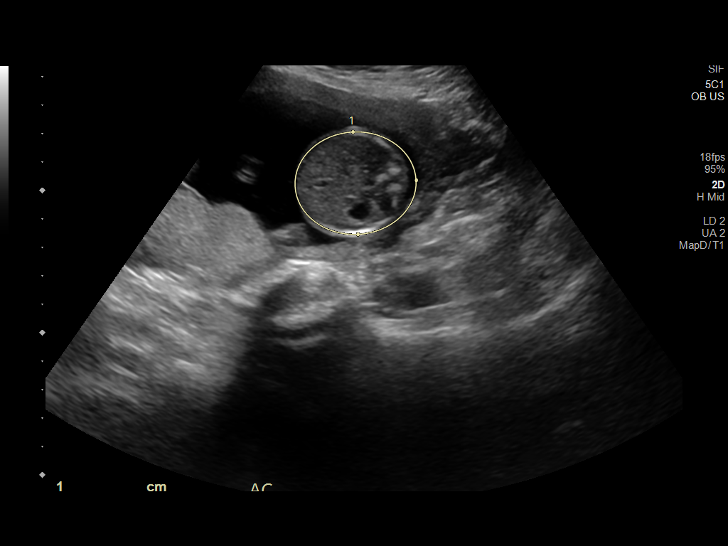
[im 33/59]
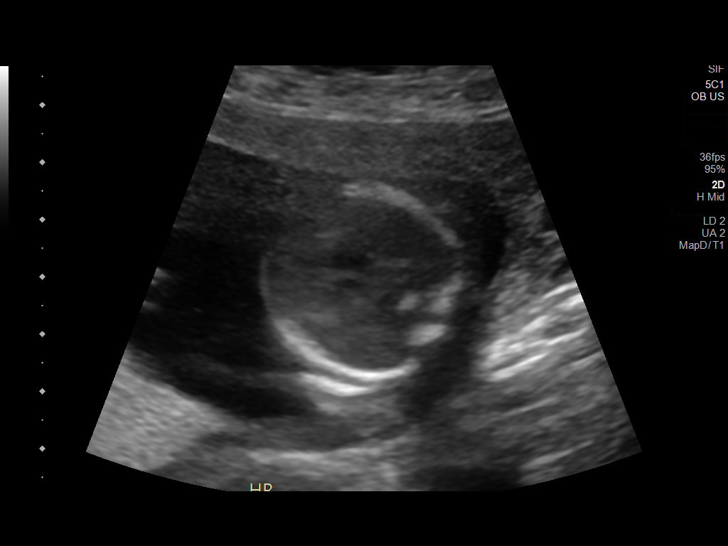
[im 37/59]
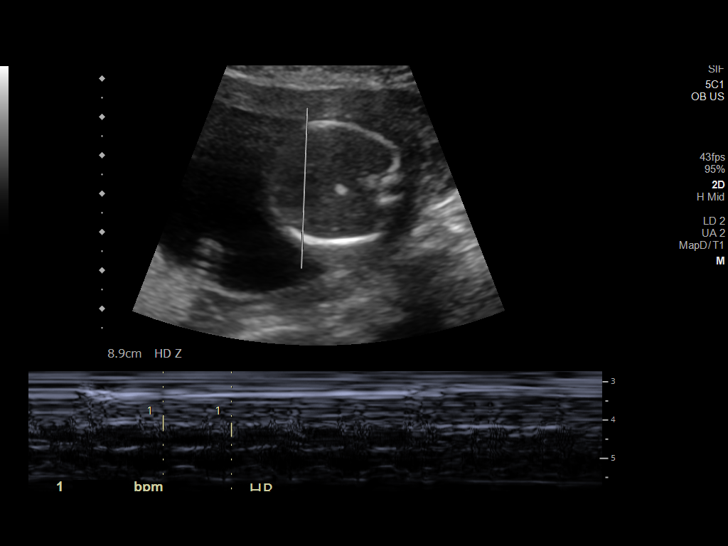
[im 41/59]
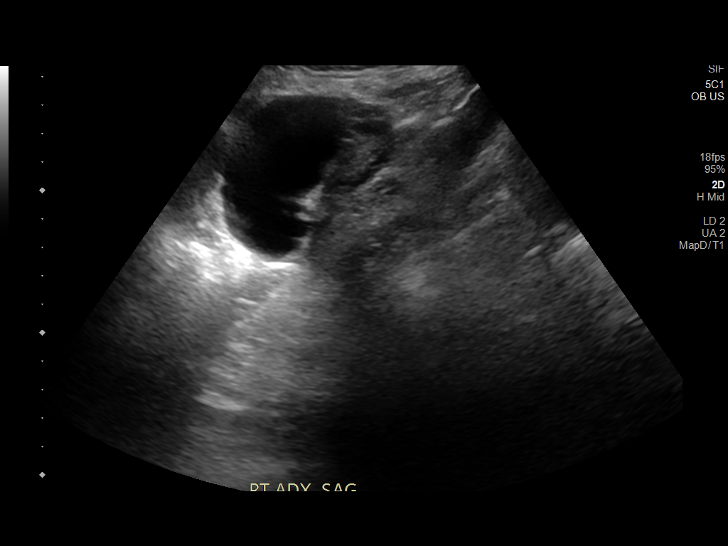
[im 46/59]
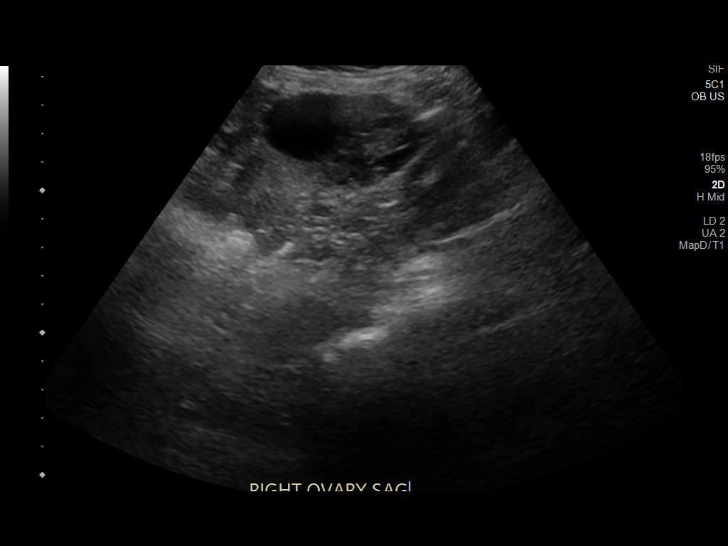
[im 50/59]
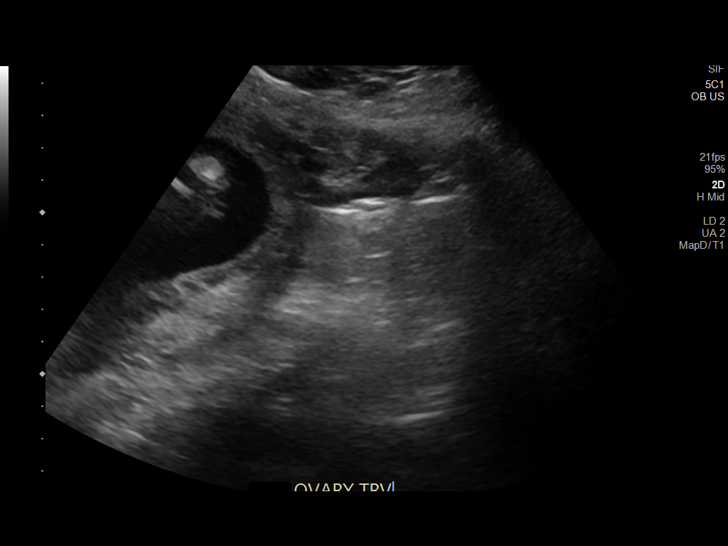
[im 54/59]
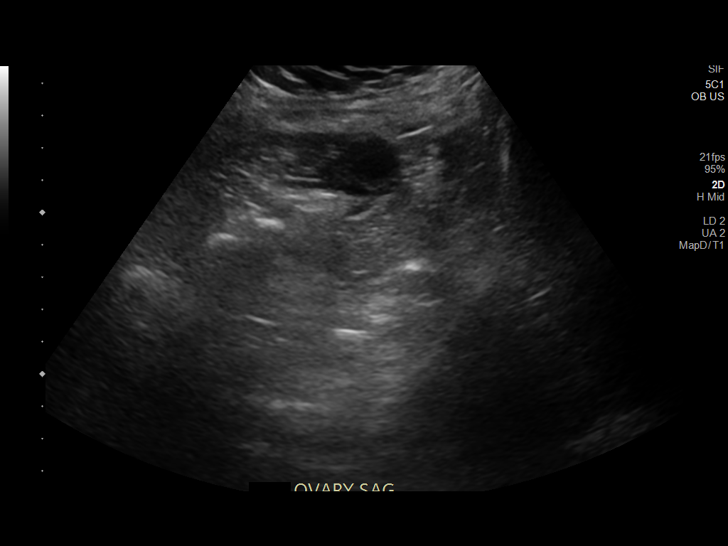
[im 59/59]
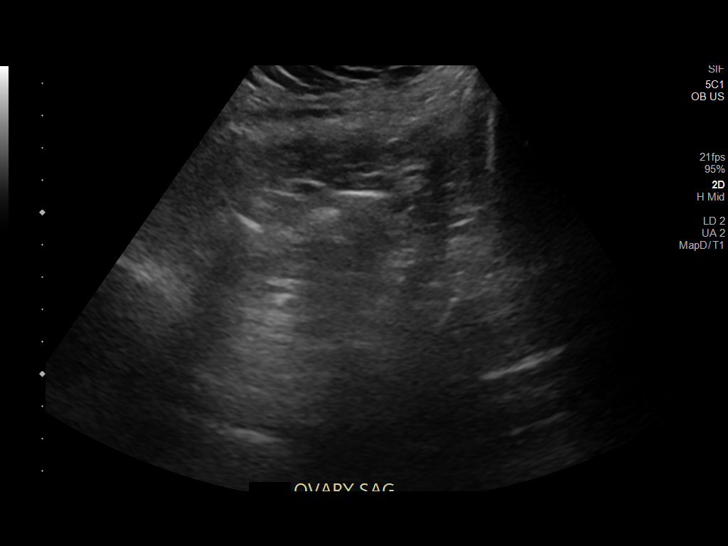

[14 of 28 positions shown; findings below may reference images not displayed]

FINDINGS: Number of Fetuses: 1

Heart Rate:  150 bpm

Movement: Present

Presentation: Cephalic

Placental Location: Posterior on the right

Previa: Negative

Amniotic Fluid (Subjective):  Within normal limits.

Femur length: 2.6 cm 18 w  0 d

MATERNAL FINDINGS:

Cervix:  Appears closed.

Uterus/Adnexae: Right ovary roughly 5.5 cm anechoic cyst with no
vascular elements (image 42). The left ovary appears normal with a
small 10 mm cyst or follicle (image 55). No pelvic free fluid.
IMPRESSION: 1. Viable Singleton fetus with estimated gestational age of 18 weeks
and 0 days by femur length.
2. Simple appearing right ovarian cyst, 5.5 cm. Recommend a
postpartum follow-up ultrasound.

This exam is performed on an emergent basis and does not
comprehensively evaluate fetal size, dating, or anatomy; follow-up
complete OB US should be considered if further fetal assessment is
warranted.
